# Patient Record
Sex: Female | Born: 1955 | Race: Black or African American | Hispanic: No | Smoking: Current every day smoker
Health system: Southern US, Community
[De-identification: ages and names within clinical notes are randomized; demographics above are authoritative.]

## PROBLEM LIST (undated history)

## (undated) DIAGNOSIS — M545 Low back pain, unspecified: Secondary | ICD-10-CM

## (undated) DIAGNOSIS — N898 Other specified noninflammatory disorders of vagina: Secondary | ICD-10-CM

## (undated) DIAGNOSIS — M79606 Pain in leg, unspecified: Secondary | ICD-10-CM

## (undated) DIAGNOSIS — R011 Cardiac murmur, unspecified: Secondary | ICD-10-CM

## (undated) DIAGNOSIS — N951 Menopausal and female climacteric states: Secondary | ICD-10-CM

## (undated) DIAGNOSIS — Z780 Asymptomatic menopausal state: Secondary | ICD-10-CM

## (undated) DIAGNOSIS — E041 Nontoxic single thyroid nodule: Secondary | ICD-10-CM

## (undated) DIAGNOSIS — I10 Essential (primary) hypertension: Secondary | ICD-10-CM

## (undated) DIAGNOSIS — N644 Mastodynia: Secondary | ICD-10-CM

## (undated) DIAGNOSIS — G479 Sleep disorder, unspecified: Secondary | ICD-10-CM

## (undated) HISTORY — DX: Low back pain, unspecified: M54.50

## (undated) HISTORY — DX: Asymptomatic menopausal state: Z78.0

## (undated) HISTORY — PX: OTHER SURGICAL HISTORY: SHX169

## (undated) HISTORY — PX: OVARY SURGERY: SHX727

## (undated) HISTORY — DX: Pain in leg, unspecified: M79.606

## (undated) HISTORY — DX: Mastodynia: N64.4

## (undated) HISTORY — DX: Low back pain: M54.5

## (undated) HISTORY — DX: Sleep disorder, unspecified: G47.9

## (undated) HISTORY — DX: Menopausal and female climacteric states: N95.1

## (undated) HISTORY — DX: Nontoxic single thyroid nodule: E04.1

## (undated) HISTORY — DX: Cardiac murmur, unspecified: R01.1

## (undated) HISTORY — DX: Essential (primary) hypertension: I10

## (undated) HISTORY — DX: Other specified noninflammatory disorders of vagina: N89.8

---

## 2001-02-10 ENCOUNTER — Other Ambulatory Visit: Admission: RE | Admit: 2001-02-10 | Discharge: 2001-02-10 | Payer: Self-pay | Admitting: Obstetrics and Gynecology

## 2001-02-15 ENCOUNTER — Encounter: Payer: Self-pay | Admitting: Obstetrics and Gynecology

## 2001-02-15 ENCOUNTER — Encounter: Admission: RE | Admit: 2001-02-15 | Discharge: 2001-02-15 | Payer: Self-pay | Admitting: Obstetrics and Gynecology

## 2002-04-22 ENCOUNTER — Other Ambulatory Visit: Admission: RE | Admit: 2002-04-22 | Discharge: 2002-04-22 | Payer: Self-pay | Admitting: Obstetrics and Gynecology

## 2003-05-24 ENCOUNTER — Other Ambulatory Visit: Admission: RE | Admit: 2003-05-24 | Discharge: 2003-05-24 | Payer: Self-pay | Admitting: Obstetrics and Gynecology

## 2004-10-16 ENCOUNTER — Other Ambulatory Visit: Admission: RE | Admit: 2004-10-16 | Discharge: 2004-10-16 | Payer: Self-pay | Admitting: Obstetrics and Gynecology

## 2004-12-10 ENCOUNTER — Ambulatory Visit (HOSPITAL_COMMUNITY): Admission: RE | Admit: 2004-12-10 | Discharge: 2004-12-10 | Payer: Self-pay | Admitting: Obstetrics and Gynecology

## 2004-12-10 ENCOUNTER — Encounter (INDEPENDENT_AMBULATORY_CARE_PROVIDER_SITE_OTHER): Payer: Self-pay | Admitting: Specialist

## 2009-02-20 ENCOUNTER — Encounter (INDEPENDENT_AMBULATORY_CARE_PROVIDER_SITE_OTHER): Payer: Self-pay

## 2009-02-27 ENCOUNTER — Ambulatory Visit: Payer: Self-pay | Admitting: Internal Medicine

## 2009-03-24 HISTORY — PX: OTHER SURGICAL HISTORY: SHX169

## 2009-03-29 ENCOUNTER — Ambulatory Visit: Payer: Self-pay | Admitting: Internal Medicine

## 2009-04-03 ENCOUNTER — Encounter: Payer: Self-pay | Admitting: Internal Medicine

## 2009-08-25 ENCOUNTER — Emergency Department (HOSPITAL_BASED_OUTPATIENT_CLINIC_OR_DEPARTMENT_OTHER): Admission: EM | Admit: 2009-08-25 | Discharge: 2009-08-25 | Payer: Self-pay | Admitting: Emergency Medicine

## 2009-08-25 ENCOUNTER — Ambulatory Visit: Payer: Self-pay | Admitting: Diagnostic Radiology

## 2010-04-25 NOTE — Letter (Signed)
Summary: Patient Notice- Polyp Results  [facility]  [address] [place], [place] [address]   Phone: [phone]  Fax: [phone]        April 03, 2009 MRN: [id number]    Ashley Montgomery [address] [place], [place]  [address]    Dear Ashley Montgomery,  I am pleased to inform you that the colon polyp(s) removed during your recent colonoscopy was (were) found to be benign (no cancer detected) upon pathologic examination.The polyp was hyperplastic ( not premalignant)  I recommend you have a repeat colonoscopy examination in 10 _ years to look for recurrent polyps, as having colon polyps increases your risk for having recurrent polyps or even colon cancer in the future.  Should you develop new or worsening symptoms of abdominal pain, bowel habit changes or bleeding from the rectum or bowels, please schedule an evaluation with either your primary care physician or with me.  Additional information/recommendations:  _x_ No further action with gastroenterology is needed at this time. Please      follow-up with your primary care physician for your other healthcare      needs.  __ Please call [phone] to schedule a return visit to review your      situation.  __ Please keep your follow-up visit as already scheduled.  __ Continue treatment plan as outlined the day of your exam.  Please call us if you are having persistent problems or have questions about your condition that have not been fully answered at this time.  Sincerely,  Hart Carwin MD  This letter has been electronically signed by your physician.  Appended Document: Patient Notice- Polyp Results Letter mailed 01.12.11

## 2010-04-25 NOTE — Procedures (Signed)
Summary: Colonoscopy  Patient: [name] Note: All result statuses are Final unless otherwise noted.  Tests: (1) Colonoscopy (COL)   COL Colonoscopy           DONE     [facility]     [address]     [place], [place]  [address]           COLONOSCOPY PROCEDURE REPORT           PATIENT:  Ashley, Montgomery  MR#:  [id number]     BIRTHDATE:  02-Mar-1956, 53 yrs. old  GENDER:  female           ENDOSCOPIST:  Hedwig Morton. Juanda Chance, MD     Referred by:  Richardean Chimera, M.D.           PROCEDURE DATE:  03/29/2009     PROCEDURE:  Colonoscopy 19147     ASA CLASS:  Class I     INDICATIONS:  Routine Risk Screening           MEDICATIONS:   Versed 6 mg, Fentanyl 75 mcg           DESCRIPTION OF PROCEDURE:   After the risks benefits and     alternatives of the procedure were thoroughly explained, informed     consent was obtained.  Digital rectal exam was performed and     revealed no rectal masses.   The LB [id number] endoscope     was introduced through the anus and advanced to the cecum, which     was identified by both the appendix and ileocecal valve, without     limitations.  The quality of the prep was good, using MiraLax.     The instrument was then slowly withdrawn as the colon was fully     examined.     <<PROCEDUREIMAGES>>           FINDINGS:  There were multiple polyps identified and removed. in     the rectum and sigmoid colon. 6 diminutive polyps removed from     rectum-20 cm The polyps were removed using cold biopsy forceps     (see image4, image3, and image5).  This was otherwise a normal     examination of the colon (see image6, image1, and image2).     Retroflexed views in the rectum revealed no abnormalities.    The     scope was then withdrawn from the patient and the procedure     completed.           COMPLICATIONS:  None           ENDOSCOPIC IMPRESSION:     1) Polyps, multiple in the rectum and sigmoid colon     2) Otherwise normal examination     6  diminutive polyps removed from 0-20 cm     small internal hemorrhoids     RECOMMENDATIONS:     1) Await pathology results     2) high fiber diet           REPEAT EXAM:  In 10 year(s) for.           ______________________________     Hedwig Morton. Juanda Chance, MD           CC:           n.     eSIGNED:   Hedwig Morton. Gelisa Tieken at 03/29/2009 04:23 PM           Humberto Leep, [id number]  Note: An exclamation mark (!) indicates a result that was not dispersed into the flowsheet. Document Creation Date: 03/29/2009 4:22 PM _______________________________________________________________________  (1) Order result status: Final Collection or observation date-time: 03/29/2009 16:15 Requested date-time:  Receipt date-time:  Reported date-time:  Referring Physician:   Ordering Physician: Lina Sar [phone]) Specimen Source:  Source: Launa Grill Order Number: [id number] Lab site:   Appended Document: Colonoscopy     Procedures Next Due Date:    Colonoscopy: 03/2019

## 2010-08-09 NOTE — Op Note (Signed)
NAMENARI, Ashley Montgomery                 ACCOUNT NO.:  [id number]   MEDICAL RECORD NO.:  [id number]          PATIENT TYPE:  AMB   LOCATION:  SDC                           FACILITY:  [facility]   PHYSICIAN:  Juluis Mire, M.D.   DATE OF BIRTH:  10-21-1955   DATE OF PROCEDURE:  12/10/2004  DATE OF DISCHARGE:                                 OPERATIVE REPORT   PREOPERATIVE DIAGNOSES:  1.  Abnormal uterine bleeding.  2.  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   POSTOPERATIVE DIAGNOSES:  1.  Extensive pelvic adhesions.  2.  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  3.  Endometrial intrauterine synechiae.  4.  Small submucosal fibroid.   OPERATIVE PROCEDURES:  1.  Open laparoscopy.  2.  Extensive lysis of adhesions.  3.  Right salpingo-oophorectomy.  4.  Hysteroscopy.  5.  Dilatation and curettage.   SURGEON:  Juluis Mire, M.D.   ANESTHESIA:  General endotracheal.   ESTIMATED BLOOD LOSS:  Minimal.   PACKS AND DRAINS:  None.   INTRAOPERATIVE BLOOD REPLACED:  None.   COMPLICATIONS:  None.   INDICATIONS:  As noted in the history and physical.   PROCEDURE:  The patient was taken to the OR and placed in the supine  position.  After a satisfactory level of general endotracheal anesthesia  obtained, the patient was placed in the dorsal lithotomy position using the  Allen stirrups.  The abdomen, perineum and vagina were prepped out with  Betadine.  Exam under anesthesia did reveal a cul-de-sac fullness consistent  with known ovarian cyst.  The bladder was emptied by in-and-out  catheterization.  A Hulka tenaculum was put in place and secured.  The  patient was then draped as a sterile field.  A subumbilical incision made  with the knife and extended through the subcutaneous tissue.  The fascia was  entered sharply and the incision in the fascia extended laterally.  The  rectus muscles were separated and the perineum was entered  bluntly.  The  Taut open laparoscopic trocar was then introduced and secured.  The abdomen  was inflated with carbon dioxide.  The laparoscope was introduced.  It was  noted that the uterus had a large adhesion from the anterior part to the  left anterior abdominal wall.  This held the uterus up on the left side.  The left tube and ovary were basically unremarkable.  The ovary was somewhat  involved in adhesions.  There were adhesions from the anterior abdominal  wall between that and what looked like to be the small intestines to the  left side.  It was not involved in the entry.  On the right side there was a  large cyst involving the right ovary that filled the cul-de-sac.  It was  smooth-walled.  There were no excrescences and actually no adhesions.  The  cecum, however, was adherent to the anterior part of the uterine fundus on  the right side and somewhat adherent to the right pelvic area above the  cyst.  A 5 mm trocar  was put in place in the right lower quadrant after  visualizing the epigastric vessels.  A suprapubic trocar was put in place  under direct visualization.  We used the Gyrus bipolar.  We first took down  the adhesions, which were thin, between the anterior part of the uterine  fundus and the cecum using cautery incision.  We did note that the cautery  spread a little bit to the cecum but only minimally so.  There was no  evidence of full thickness on my evaluation.  We irrigated the area.  We  then continued taking down the adhesions between the cecum and the right  pelvic area above the cyst.  At this point in time we had nicely mobilized  up the cecum and it was away from the ovary.  At this point in time we were  able to flip the large ovarian cyst out of the pelvis.  We decided to begin  with the utero-ovarian area.  The utero-ovarian ligament then cauterized,  incised using the Gyrus.  We then dissected the cystic enlargement of the  ovary from its peritoneal  attachment down to the ovarian vasculature.  The  cyst was then elevated.  We could easily trace out the course of the ureter.  We could also visualize the cecum, and the ovarian vasculature was  cauterized, incised using the Gyrus.  At this point in time we took out the  Taut open laparoscopic trocar and laparoscope.  We brought the cyst under  the umbilicus.  It was grabbed with two Kellys.  We incised, and clear fluid  was obtained.  We deflated the cyst with the Gyrus.  The ovary was then  removed intact through the subumbilical port.  There was a smooth wall  lining.  There was no evidence of excrescences or solid areas.  It was sent  for pathologic review.  Of note, we did obtain washings prior to the  procedure.  There did not appear to be any spillage into the abdominal  cavity from the cyst.  At this point in time the Taut open laparoscopic  trocar was reintroduced.  The abdomen was reinflated of its carbon dioxide.  The laparoscope was reintroduced.  Visualization of the area of removal of  the ovary appeared to be hemostatically intact even after deflation.  We  could trace the ureter out on the right side.  It was uninvolved in the  operative procedure.  The cecum appeared to be intact on that side too.  Again we thoroughly irrigated the pelvis.  We had excellent hemostasis.  The  abdomen was deflated of its carbon dioxide, all trocars removed, the  subumbilical fascia closed with two figure-of-eights of 0 Vicryl, the skin  with interrupted subcuticulars of 4-0 Vicryl.  The suprapubic incisions were  closed with Dermabond.   At this point in time the legs were repositioned, the Hulka tenaculum was  removed.  A speculum was placed in the vaginal vault.  The cervix was  grasped with a single-tooth tenaculum.  The uterus sounded to 8 cm.  The  cervix was serially dilated to a size 35 Pratt dilator.  The operative  hysteroscope was introduced into the intrauterine cavity.  The  intrauterine cavity was distended using sorbitol.  Visualization revealed what looked  like a fundal synechia.  There was a submucosal fibroid to the right which  was beyond the synechia, which we could break down.  We therefore obtained  endometrial curettings, which were  sent for pathologic review.  We had good  hemostasis, no signs of perforation.  The single-tooth tenaculum and  speculum were removed.  The patient was taken out of the dorsal lithotomy  position, once alert extubated and transferred to the recovery room in good  condition.  Sponge,  instrument and needle reported as correct by the  circulating nurse x2.      Juluis Mire, M.D.  Electronically Signed     JSM/MEDQ  D:  12/10/2004  T:  12/10/2004  Job:  [id number]

## 2010-08-09 NOTE — H&P (Signed)
NAME:  Ashley Montgomery, Ashley Montgomery NO.:  [id number]   MEDICAL RECORD NO.:  [id number]           PATIENT TYPE:   LOCATION:                                 FACILITY:   PHYSICIAN:  Juluis Mire, M.D.        DATE OF BIRTH:   DATE OF ADMISSION:  12/10/2004  DATE OF DISCHARGE:                                HISTORY & PHYSICAL   Patient is a 55 year old gravida 2, para 1, abortus 1 black female who  presents for evaluation of cystic enlargement of the left adnexa as well as  abnormal bleeding.   In relation to the present admission, patient has a history of uterine  fibroids.  These were noted on previous infusion ultrasound.  She has been  having some abnormal bleeding when seen for her annual examination on July  26.  Because of this an examination was performed.  It was felt like the  uterus had enlarged 9-10 weeks.  She underwent a follow-up ultrasound  evaluation.  Saline infusion revealed a fibroid in the anterior wall that  protruded into the endometrial cavity.  However, the most significant  finding was an 8-9 cm mostly cystic simple cyst of the left ovary.  There  was one small septation.  There was no free fluid.  It did have some  findings suggestive of mucinous cystadenoma.  Subsequent CA-125 was within  normal limits.  Follow-up ultrasound revealed persistence of this 9 cm  cystic mass of the left adnexa.  In view of this we are going to proceed  with hysteroscopy for evaluation of endometrial cavity as well as  laparoscopy with left salpingo-oophorectomy.  She does understand the  potential need for exploratory surgery with possible surgical management  through the incision to remove the left ovary and possible hysterectomy and  removal of right ovary pending the results from pathology.   ALLERGIES:  She has no known drug allergies.   MEDICATIONS:  None.   PAST MEDICAL HISTORY:  Usual childhood diseases without any significant  sequelae.   PAST SURGICAL  HISTORY:  She did have a cesarean section with her first  pregnancy for fetal distress.  She has had ovarian cysts removed twice in  1978 and 1990.  She has also had a previous D&E for nonviable first  trimester pregnancy.   FAMILY HISTORY:  Noncontributory.   SOCIAL HISTORY:  Tobacco use.  She smokes approximately 1/3 of a pack per  day.  Occasional alcohol use.   REVIEW OF SYSTEMS:  Noncontributory.   PHYSICAL EXAMINATION:  VITAL SIGNS:  Patient is afebrile with stable vital  signs.  HEENT:  Patient is normocephalic.  Pupils are equal, round, and reactive to  light and accommodation.  Extraocular movements were intact.  Sclerae and  conjunctivae clear.  Oropharynx clear.  NECK:  Without thyromegaly.  BREASTS:  No discrete masses.  LUNGS:  Clear.  CARDIOVASCULAR:  Regular rate and rhythm without murmurs or gallops.  ABDOMEN:  Benign.  No masses, organomegaly, or tenderness.  PELVIC:  Normal external genitalia.  Vaginal mucosa clear.  Cervix  unremarkable.  Uterus enlarged with left side fullness consistent with known  ovarian cysts.  Right adnexa unremarkable.  EXTREMITIES:  Trace edema.  NEUROLOGIC:  Grossly within normal limits.   IMPRESSION:  1.  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  2.  Abnormal bleeding probably anovulatory nature.  3.  Uterine fibroids.   PLAN:  Patient will undergo open laparoscopy with attempt at left salpingo-  oophorectomy.  If we are unable to approach this through the laparoscope an  incision will be made for proper evaluation and removal of the left tube and  ovary.  Uterus may be removed at that point in time in view of her abnormal  bleeding pattern.  The remainder of her ovary could be left in place pending  pathology from the left ovary.  Should this be a borderline or malignant  tumor further staging procedures may be undertaken.  The risks of surgery  have been discussed including the risk of infection, the  risk of hemorrhage  that could require transfusion, the risk of AIDS or hepatitis, the risk of  injury to adjacent organs including bladder, bowel, or ureters that could  require further exploratory surgery, the risk of deep venous thrombosis and  pulmonary embolus.  Patient expressed understanding of indications and  risks.      Juluis Mire, M.D.  Electronically Signed     JSM/MEDQ  D:  12/10/2004  T:  12/10/2004  Job:  [id number]

## 2014-01-06 ENCOUNTER — Encounter: Payer: Self-pay | Admitting: Internal Medicine

## 2014-06-01 ENCOUNTER — Other Ambulatory Visit: Payer: Self-pay | Admitting: Obstetrics and Gynecology

## 2014-06-01 DIAGNOSIS — N644 Mastodynia: Secondary | ICD-10-CM

## 2014-06-06 ENCOUNTER — Ambulatory Visit
Admission: RE | Admit: 2014-06-06 | Discharge: 2014-06-06 | Disposition: A | Discharge status: Home / Self Care | Payer: BC Managed Care – PPO | Source: Ambulatory Visit | Attending: Obstetrics and Gynecology | Admitting: Obstetrics and Gynecology

## 2014-06-06 DIAGNOSIS — N644 Mastodynia: Secondary | ICD-10-CM

## 2016-03-12 ENCOUNTER — Other Ambulatory Visit: Payer: Self-pay | Admitting: Obstetrics and Gynecology

## 2016-03-12 DIAGNOSIS — E041 Nontoxic single thyroid nodule: Secondary | ICD-10-CM

## 2016-03-12 DIAGNOSIS — M79622 Pain in left upper arm: Secondary | ICD-10-CM

## 2016-03-13 ENCOUNTER — Ambulatory Visit
Admission: RE | Admit: 2016-03-13 | Discharge: 2016-03-13 | Disposition: A | Discharge status: Home / Self Care | Payer: BC Managed Care – PPO | Source: Ambulatory Visit | Attending: Obstetrics and Gynecology | Admitting: Obstetrics and Gynecology

## 2016-03-13 DIAGNOSIS — E041 Nontoxic single thyroid nodule: Secondary | ICD-10-CM

## 2016-03-25 ENCOUNTER — Other Ambulatory Visit: Payer: BC Managed Care – PPO

## 2016-04-16 ENCOUNTER — Ambulatory Visit: Payer: BC Managed Care – PPO | Admitting: Cardiovascular Disease

## 2016-05-02 ENCOUNTER — Ambulatory Visit
Admission: RE | Admit: 2016-05-02 | Discharge: 2016-05-02 | Disposition: A | Discharge status: Home / Self Care | Payer: BC Managed Care – PPO | Source: Ambulatory Visit | Attending: Obstetrics and Gynecology | Admitting: Obstetrics and Gynecology

## 2016-05-02 DIAGNOSIS — M79622 Pain in left upper arm: Secondary | ICD-10-CM

## 2016-06-03 ENCOUNTER — Encounter: Payer: Self-pay | Admitting: Cardiovascular Disease

## 2016-06-03 ENCOUNTER — Other Ambulatory Visit: Payer: Self-pay | Admitting: Cardiovascular Disease

## 2016-06-09 ENCOUNTER — Encounter: Payer: Self-pay | Admitting: Cardiovascular Disease

## 2016-06-09 ENCOUNTER — Encounter (INDEPENDENT_AMBULATORY_CARE_PROVIDER_SITE_OTHER): Payer: Self-pay

## 2016-06-09 ENCOUNTER — Ambulatory Visit (INDEPENDENT_AMBULATORY_CARE_PROVIDER_SITE_OTHER): Payer: BC Managed Care – PPO | Admitting: Cardiovascular Disease

## 2016-06-09 VITALS — BP 150/100 | HR 62 | Ht 61.0 in | Wt 154.0 lb

## 2016-06-09 DIAGNOSIS — E782 Mixed hyperlipidemia: Secondary | ICD-10-CM | POA: Diagnosis not present

## 2016-06-09 NOTE — Progress Notes (Signed)
Cardiology Office Note   Date:  06/09/2016   ID:  Ashley Montgomery, DOB February 23, 1956, MRN [id number]  PCP:  Ashley MireMCCOMB,JOHN S, MD  Cardiologist:   Ashley MissPhilip Rhys Anchondo, MD   Chief Complaint  Patient presents with  . Hypertension   Problem List 1. Elevated CRP 2. Essential HTN    History of Present Illness: Ashley Montgomery is a 61 y.o. female who presents for eval of mildly Elevated cholesterol levels and normal CRP levels.  Typically exercises but has not done much this winter.  No CP or dyspnea.  Is very active.   Was incidentally noted to have a mildly elevated LDL and a CRP of 1.5 ( average)  Does not eat a restricted diet. Does not eat much meat .   Walks lots in the summer. Working on her doctorate degree.   Smokes occasionally 1-2 cigarettes a day .  Encouraged her to stop   Mother died of an MI   Past Medical History:  Diagnosis Date  . Breast pain, left   . Hot flashes, menopausal   . Left thyroid nodule   . Low back pain radiating to lower extremity   . Postmenopausal   . Sleep disturbances   . Vaginal dryness     Past Surgical History:  Procedure Laterality Date  . colonscopy  2011  . vasomotor symptomatology       Current Outpatient Prescriptions  Medication Sig Dispense Refill  . aspirin 81 MG chewable tablet Chew 81 mg by mouth daily.     No current facility-administered medications for this visit.     PAD Screen 06/09/2016  Previous PAD dx? No  Previous surgical procedure? No  Pain with walking? No  Feet/toe relief with dangling? No  Painful, non-healing ulcers? No  Extremities discolored? No      Allergies:   Patient has no known allergies.    Social History:  The patient  reports that she has been smoking.  She has never used smokeless tobacco. She reports that she drinks alcohol. She reports that she does not use drugs.   Family History:  The patient'Montgomery family history includes Diabetes in her sister; Heart attack (age of onset: 5540) in her  mother; Heart failure in her sister.    ROS:  Please see the history of present illness.    Review of Systems: Constitutional:  denies fever, chills, diaphoresis, appetite change and fatigue.  HEENT: denies photophobia, eye pain, redness, hearing loss, ear pain, congestion, sore throat, rhinorrhea, sneezing, neck pain, neck stiffness and tinnitus.  Respiratory: denies SOB, DOE, cough, chest tightness, and wheezing.  Cardiovascular: denies chest pain, palpitations and leg swelling.  Gastrointestinal: denies nausea, vomiting, abdominal pain, diarrhea, constipation, blood in stool.  Genitourinary: denies dysuria, urgency, frequency, hematuria, flank pain and difficulty urinating.  Musculoskeletal: denies  myalgias, back pain, joint swelling, arthralgias and gait problem.   Skin: denies pallor, rash and wound.  Neurological: denies dizziness, seizures, syncope, weakness, light-headedness, numbness and headaches.   Hematological: denies adenopathy, easy bruising, personal or family bleeding history.  Psychiatric/ Behavioral: denies suicidal ideation, mood changes, confusion, nervousness, sleep disturbance and agitation.       All other systems are reviewed and negative.    PHYSICAL EXAM: VS:  BP (!) 150/100 (BP Location: Left Arm, Patient Position: Sitting, Cuff Size: Normal)   Pulse 62   Ht 5\' 1"  (1.549 m)   Wt 154 lb (69.9 kg)   SpO2 99%   BMI 29.10 kg/m  ,  BMI Body mass index is 29.1 kg/m. GEN: Well nourished, well developed, in no acute distress  HEENT: normal  Neck: no JVD, carotid bruits, or masses Cardiac: RRR; no murmurs, rubs, or gallops,no edema  Respiratory:  clear to auscultation bilaterally, normal work of breathing GI: soft, nontender, nondistended, + BS MS: no deformity or atrophy  Skin: warm and dry, no rash Neuro:  Strength and sensation are intact Psych: normal   EKG:  EKG is ordered today. The ekg ordered today demonstrates  NSR at 62.   , normal     Recent Labs: No results found for requested labs within last 8760 hours.    Lipid Panel No results found for: CHOL, TRIG, HDL, CHOLHDL, VLDL, LDLCALC, LDLDIRECT    Wt Readings from Last 3 Encounters:  06/09/16 154 lb (69.9 kg)      Other studies Reviewed: Additional studies/ records that were reviewed today include: . Records from Dr. Gaye Alken.   Review of the above records demonstrates: normal CRP Mildly elevated cholesterol levels    ASSESSMENT AND PLAN:  1.  Mild hyperlipidemia:  Recent labs reveal an LDL cholesterol of 120. Her CRP is 1.5.  Overall these numbers are only very minimally elevated. She smokes 1-2 cigarettes a day.  She admits that she has not been getting as much exercise as she normally does. She also admits that she does not pick attention to her diet. She does not want to start a statin medication at this time. She would like to try diet and exercise. I think that she is at relatively low risk I think this is a good option.  If she is not able to bring her cholesterol down,   I advised her that low dose atorvastatin ( 10-20 mg a day ) would probably work.  I will see her on an as-needed basis.   Current medicines are reviewed at length with the patient today.  The patient does not have concerns regarding medicines.  Labs/ tests ordered today include:  No orders of the defined types were placed in this encounter.    Disposition:   FU with me as needed.      Ashley Miss, MD  06/09/2016 8:55 AM    [facility] [address] [place], [place], [place]  [address] Phone: [phone]; Fax: [phone]

## 2016-06-09 NOTE — Patient Instructions (Signed)

## 2017-07-01 ENCOUNTER — Other Ambulatory Visit: Payer: Self-pay | Admitting: Obstetrics and Gynecology

## 2017-07-01 DIAGNOSIS — E041 Nontoxic single thyroid nodule: Secondary | ICD-10-CM

## 2017-07-06 ENCOUNTER — Ambulatory Visit
Admission: RE | Admit: 2017-07-06 | Discharge: 2017-07-06 | Disposition: A | Discharge status: Home / Self Care | Payer: BC Managed Care – PPO | Source: Ambulatory Visit | Attending: Obstetrics and Gynecology | Admitting: Obstetrics and Gynecology

## 2017-07-06 DIAGNOSIS — E041 Nontoxic single thyroid nodule: Secondary | ICD-10-CM

## 2019-06-02 ENCOUNTER — Ambulatory Visit: Payer: BC Managed Care – PPO | Attending: Family

## 2019-06-02 DIAGNOSIS — Z23 Encounter for immunization: Secondary | ICD-10-CM

## 2019-06-02 NOTE — Progress Notes (Signed)
   Covid-19 Vaccination Clinic  Name:  Ashley Montgomery    MRN: [id number] DOB: [birth year]  06/02/2019  [name] was observed post Covid-19 immunization for 15 minutes without incident. She was provided with Vaccine Information Sheet and instruction to access the V-Safe system.   [name] was instructed to call 911 with any severe reactions post vaccine: Marland Kitchen Difficulty breathing  . Swelling of face and throat  . A fast heartbeat  . A bad rash all over body  . Dizziness and weakness   Immunizations Administered    Name Date Dose VIS Date Route   Moderna COVID-19 Vaccine 06/02/2019  3:52 PM 0.5 mL 02/22/2019 Intramuscular   Manufacturer: Moderna   Lot: [id number]   NDC: 40992-780-04

## 2019-07-05 ENCOUNTER — Ambulatory Visit: Payer: BC Managed Care – PPO

## 2019-07-05 ENCOUNTER — Ambulatory Visit: Payer: BC Managed Care – PPO | Attending: Family

## 2019-07-05 DIAGNOSIS — Z23 Encounter for immunization: Secondary | ICD-10-CM

## 2019-07-05 NOTE — Progress Notes (Signed)
   Covid-19 Vaccination Clinic  Name:  Ashley Montgomery    MRN: [id number] DOB: [birth year]  07/05/2019  [name] was observed post Covid-19 immunization for 15 minutes without incident. She was provided with Vaccine Information Sheet and instruction to access the V-Safe system.   [name] was instructed to call 911 with any severe reactions post vaccine: Marland Kitchen Difficulty breathing  . Swelling of face and throat  . A fast heartbeat  . A bad rash all over body  . Dizziness and weakness   Immunizations Administered    Name Date Dose VIS Date Route   Moderna COVID-19 Vaccine 07/05/2019  1:12 PM 0.5 mL 02/22/2019 Intramuscular   Manufacturer: Moderna   Lot: [id number]   NDC: 83073-543-01

## 2021-02-13 ENCOUNTER — Encounter: Payer: Self-pay | Admitting: Gastroenterology

## 2021-04-02 ENCOUNTER — Ambulatory Visit (AMBULATORY_SURGERY_CENTER): Payer: BC Managed Care – PPO

## 2021-04-02 ENCOUNTER — Other Ambulatory Visit: Payer: Self-pay

## 2021-04-02 VITALS — Ht 61.0 in | Wt 142.0 lb

## 2021-04-02 DIAGNOSIS — Z1211 Encounter for screening for malignant neoplasm of colon: Secondary | ICD-10-CM

## 2021-04-02 MED ORDER — NA SULFATE-K SULFATE-MG SULF 17.5-3.13-1.6 GM/177ML PO SOLN: 1.0000 | Freq: Once | ORAL | 0 refills | Status: AC

## 2021-04-02 NOTE — Progress Notes (Signed)

## 2021-04-16 ENCOUNTER — Telehealth: Payer: Self-pay

## 2021-04-16 ENCOUNTER — Encounter: Payer: Self-pay | Admitting: Gastroenterology

## 2021-04-16 ENCOUNTER — Encounter: Payer: BC Managed Care – PPO | Admitting: Gastroenterology

## 2021-04-16 NOTE — Telephone Encounter (Signed)
Called pt to see if she was on her way for appt today. Pt stated that she would not be coming to appt today because she had "other appts". She did not want to reschedule today, will call back to reschedule.

## 2021-04-16 NOTE — Telephone Encounter (Signed)
Thank you for the update. This should be regarded as a no show.

## 2021-05-31 ENCOUNTER — Other Ambulatory Visit: Payer: Self-pay | Admitting: Nurse Practitioner

## 2021-05-31 DIAGNOSIS — M79622 Pain in left upper arm: Secondary | ICD-10-CM

## 2021-06-21 ENCOUNTER — Ambulatory Visit
Admission: RE | Admit: 2021-06-21 | Discharge: 2021-06-21 | Disposition: A | Discharge status: Home / Self Care | Payer: BC Managed Care – PPO | Source: Ambulatory Visit | Attending: Nurse Practitioner | Admitting: Nurse Practitioner

## 2021-06-21 ENCOUNTER — Other Ambulatory Visit: Payer: Self-pay | Admitting: Nurse Practitioner

## 2021-06-21 DIAGNOSIS — M79622 Pain in left upper arm: Secondary | ICD-10-CM

## 2021-06-21 DIAGNOSIS — N644 Mastodynia: Secondary | ICD-10-CM

## 2021-09-18 ENCOUNTER — Encounter: Payer: Self-pay | Admitting: Gastroenterology

## 2023-05-25 IMAGING — MG MM DIGITAL DIAGNOSTIC UNILAT*L* W/ TOMO W/ CAD
8 series · 8 of 24 positions shown · non-contrast
Comparison: Previous exam(s).

CLINICAL DATA: 65-year-old female with focal pain in the left
breast and axilla.

EXAM:
DIGITAL DIAGNOSTIC UNILATERAL LEFT MAMMOGRAM WITH TOMOSYNTHESIS AND
CAD; ULTRASOUND LEFT BREAST LIMITED
TECHNIQUE: Left digital diagnostic mammography and breast tomosynthesis was
performed. The images were evaluated with computer-aided detection.;
Targeted ultrasound examination of the left breast was performed.

[L MLO synth-2D (1 of 3)]
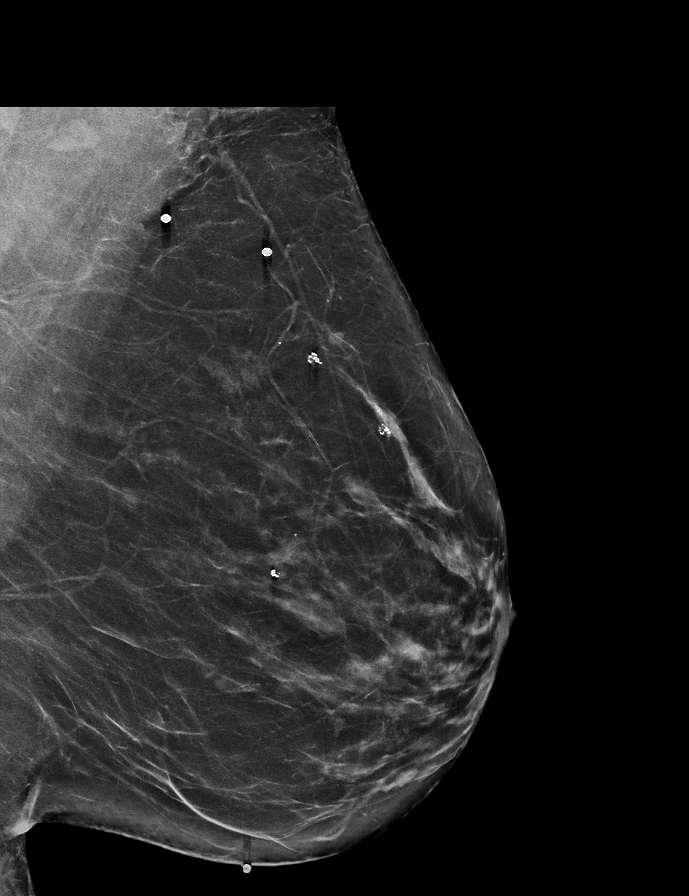

[L MLO synth-2D (2 of 3)]
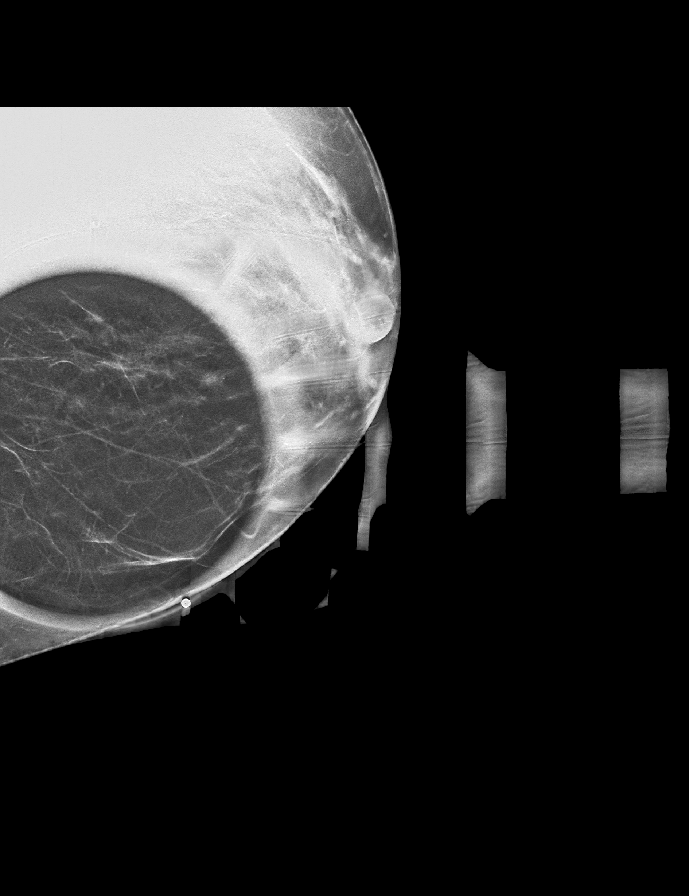

[L MLO synth-2D (3 of 3)]
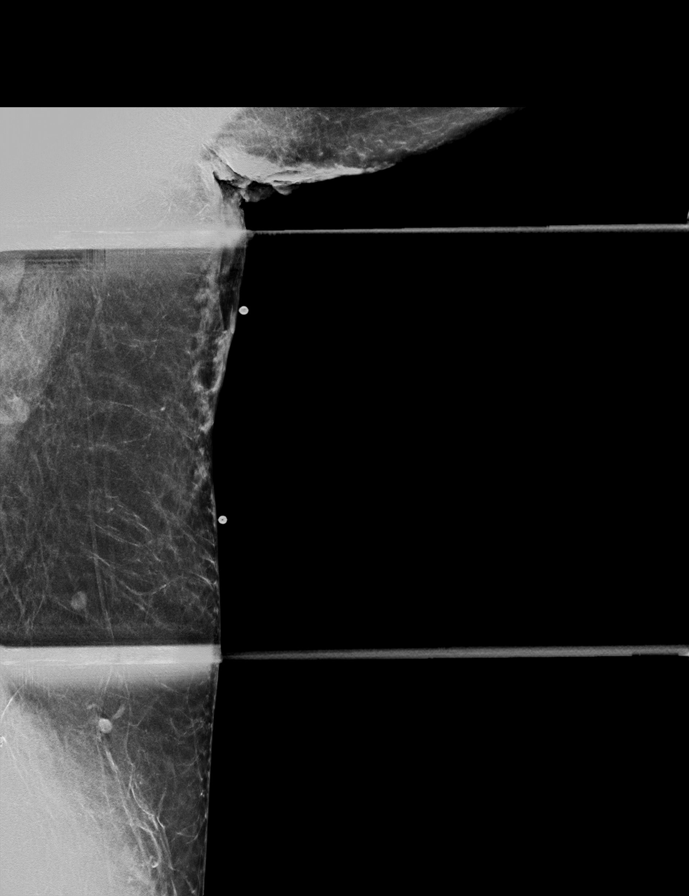

[L CC synth-2D]
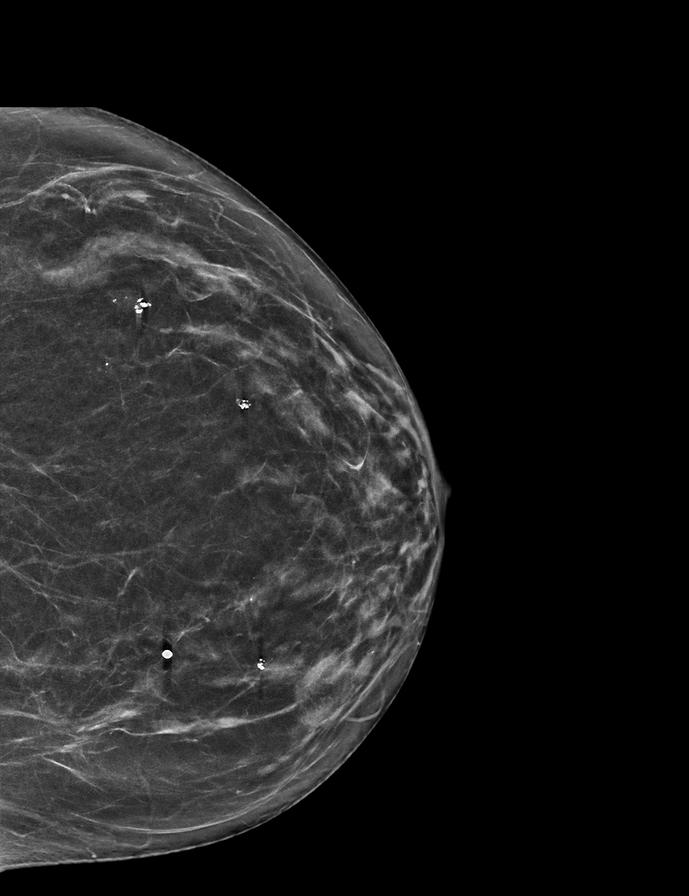

[L MLO tomo (1 of 3) · tomo slice 23/45.0]
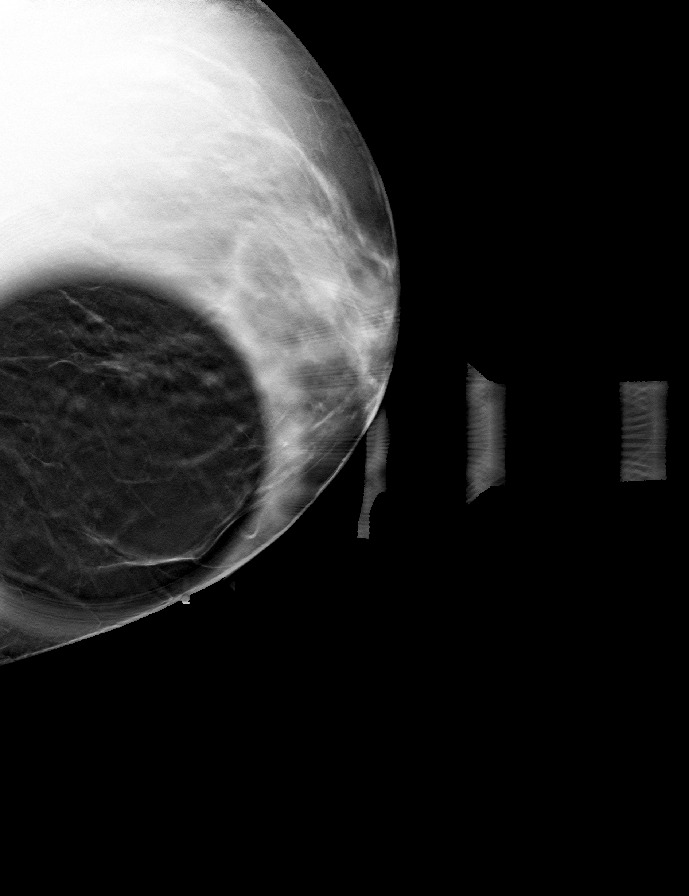

[L MLO tomo (2 of 3) · tomo slice 27/53.0]
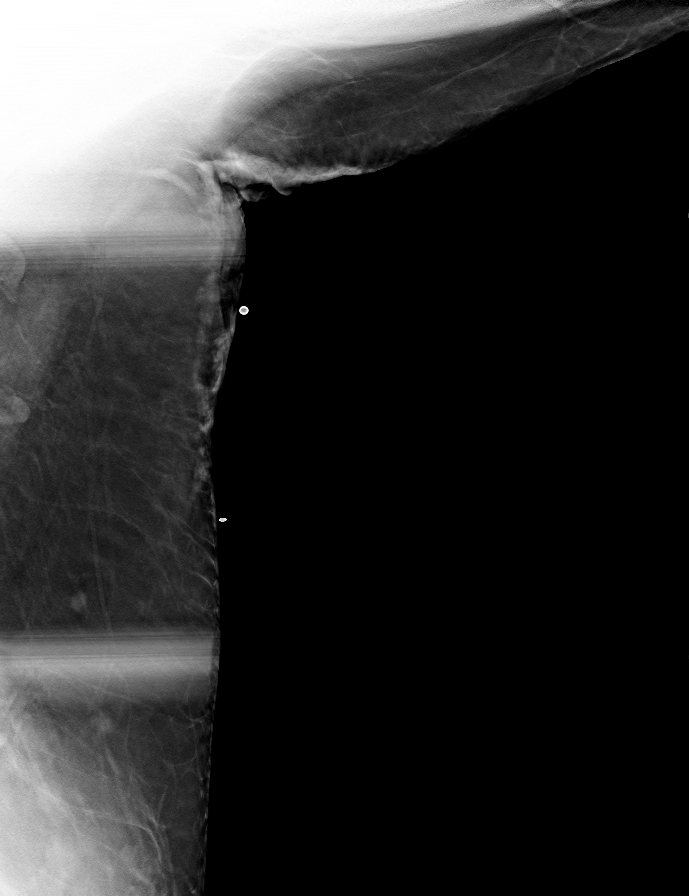

[L CC tomo · tomo slice 30/59.0]
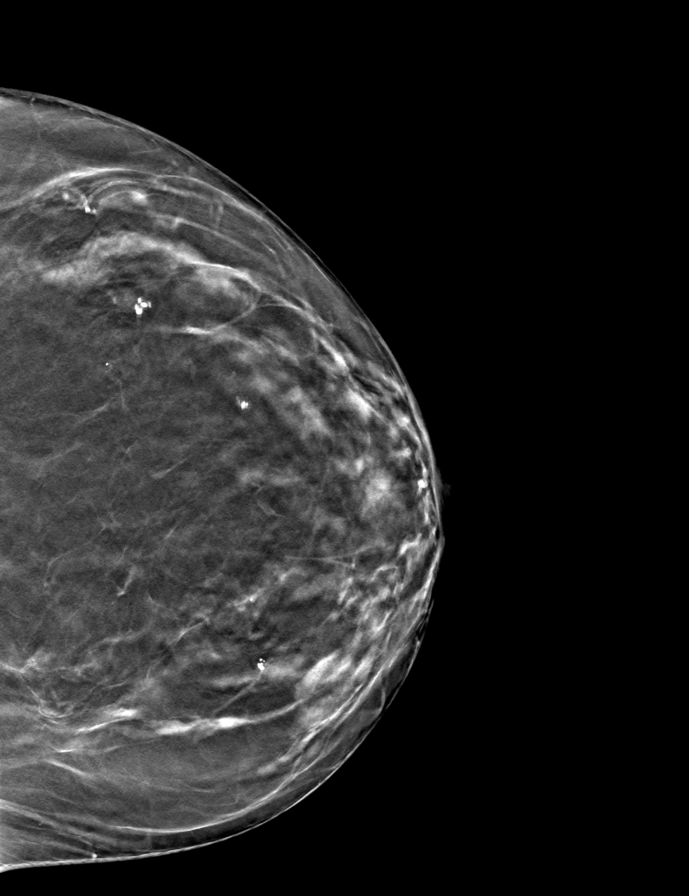

[L MLO tomo (3 of 3) · tomo slice 35/70.0]
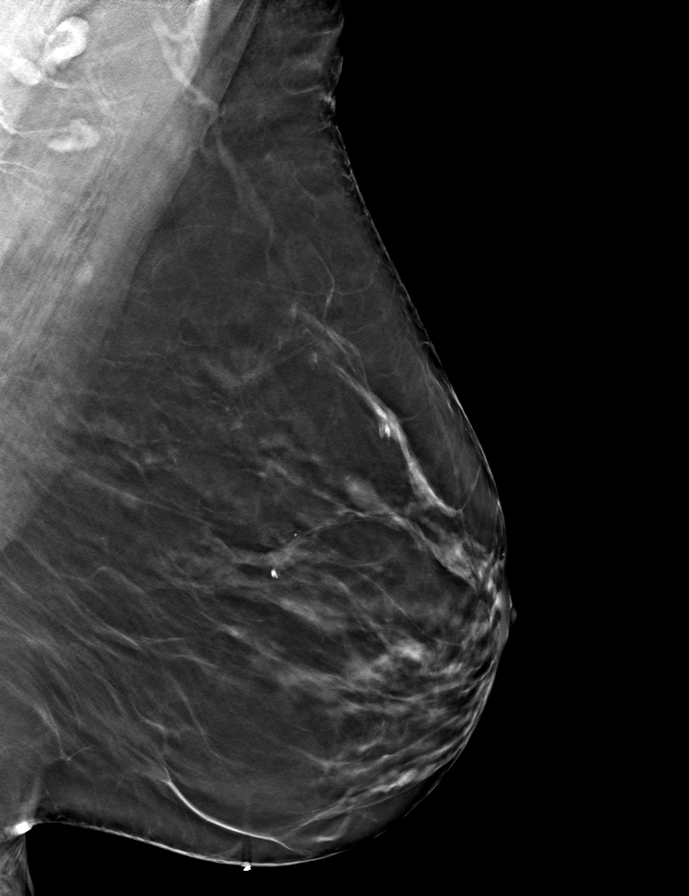

[8 of 24 positions shown; findings below may reference images not displayed]

ACR Breast Density Category c: The breast tissue is heterogeneously
dense, which may obscure small masses.
FINDINGS: Radiopaque BBs were placed at the sites of the patient's focal
symptoms in the inferior left breast and left axilla. No focal or
suspicious mammographic findings are seen deep to the radiopaque BB
or elsewhere within the left breast.

Targeted ultrasound is performed, showing focal or suspicious
sonographic findings within the inferior left breast and left
axilla. Morphologically normal lymph nodes noted within the left
axilla.
IMPRESSION: Unremarkable mammographic and sonographic evaluation of the left
breast and axilla.

RECOMMENDATION:
1. Clinical follow-up recommended for the painful areas of concern
in the left breast and axilla. Any further workup should be based on
clinical grounds.
2. The patient is due for routine annual screening in December 2021.

I have discussed the findings and recommendations with the patient.
If applicable, a reminder letter will be sent to the patient
regarding the next appointment.

BI-RADS CATEGORY  1: Negative.

## 2023-10-16 ENCOUNTER — Other Ambulatory Visit: Payer: Self-pay | Admitting: Obstetrics and Gynecology

## 2023-10-16 DIAGNOSIS — E049 Nontoxic goiter, unspecified: Secondary | ICD-10-CM

## 2023-10-21 ENCOUNTER — Inpatient Hospital Stay
Admission: RE | Admit: 2023-10-21 | Discharge: 2023-10-21 | Discharge status: Home / Self Care | Payer: Self-pay | Source: Ambulatory Visit | Attending: Obstetrics and Gynecology | Admitting: Obstetrics and Gynecology

## 2023-10-21 DIAGNOSIS — E049 Nontoxic goiter, unspecified: Secondary | ICD-10-CM

## 2023-11-18 ENCOUNTER — Other Ambulatory Visit (HOSPITAL_COMMUNITY): Payer: Self-pay | Admitting: Neurosurgery

## 2023-11-18 DIAGNOSIS — R011 Cardiac murmur, unspecified: Secondary | ICD-10-CM

## 2023-11-19 ENCOUNTER — Ambulatory Visit (HOSPITAL_COMMUNITY)
Admission: RE | Admit: 2023-11-19 | Discharge: 2023-11-19 | Disposition: A | Discharge status: Home / Self Care | Source: Ambulatory Visit | Attending: Neurosurgery | Admitting: Neurosurgery

## 2023-11-19 DIAGNOSIS — I351 Nonrheumatic aortic (valve) insufficiency: Secondary | ICD-10-CM | POA: Diagnosis not present

## 2023-11-19 DIAGNOSIS — R011 Cardiac murmur, unspecified: Secondary | ICD-10-CM | POA: Diagnosis present

## 2023-11-19 LAB — ECHOCARDIOGRAM COMPLETE
AR max vel: 2.24 cm2
AV Area VTI: 2.09 cm2
AV Area mean vel: 2.1 cm2
AV Mean grad: 7.5 mmHg
AV Peak grad: 14.1 mmHg
Ao pk vel: 1.88 m/s
Area-P 1/2: 2.87 cm2
Calc EF: 68.4 %
S' Lateral: 2.7 cm
Single Plane A2C EF: 70.4 %
Single Plane A4C EF: 66.5 %

## 2023-11-19 NOTE — Progress Notes (Signed)
  Echocardiogram 2D Echocardiogram has been performed.  Ashley Montgomery 11/19/2023, 12:08 PM

## 2023-12-02 ENCOUNTER — Encounter: Payer: Self-pay | Admitting: Gastroenterology

## 2024-01-04 ENCOUNTER — Ambulatory Visit (AMBULATORY_SURGERY_CENTER): Admitting: *Deleted

## 2024-01-04 VITALS — Ht 61.0 in | Wt 130.0 lb

## 2024-01-04 DIAGNOSIS — Z1211 Encounter for screening for malignant neoplasm of colon: Secondary | ICD-10-CM

## 2024-01-04 MED ORDER — NA SULFATE-K SULFATE-MG SULF 17.5-3.13-1.6 GM/177ML PO SOLN: 1.0000 | Freq: Once | ORAL | 0 refills | Status: AC

## 2024-01-04 NOTE — Progress Notes (Signed)
 Pt's name and DOB verified at the beginning of the pre-visit with 2 identifiers  Pt denies any difficulty with ambulating,sitting, laying down or rolling side to side  Pt has no issues moving head neck or swallowing  No egg or soy allergy known to patient   No issues known to pt with past sedation except last colon she woke up aggressive  No FH of Malignant Hyperthermia  Pt is not on home 02   Pt is not on blood thinners   Pt denies issues with constipation   Pt is not on dialysis  Pt denise any abnormal heart rhythmsdoes have heart mumur   Pt denies any upcoming cardiac testing  Patient's chart reviewed by Norleen Schillings CNRA prior to pre-visit and patient appropriate for the [facility].  Pre-visit completed and red dot placed by patient's name on their procedure day (on provider's schedule).    Visit by phone  Pt states weight is 130 lb   Pt given  both [facility] main # and MD on call # prior to instructions.  Informed pt to come in at the time discussed and is shown on PV instructions.  Pt instructed to use [employer] or GoodRx for a price reduction on prep  Instructed pt where to find PV instructions in My Ch. Copy of instructions  to be sent in mail and address read back to pt to verify correct on envelope. Instructed pt on all aspects of written instructions including med holds clothing to wear and foods to eat and not eat as well as after procedure legal restrictions and to call MD on call if needed.. Pt states understanding. Instructed pt to review instructions again prior to procedure and call main # given if has any questions or any issues. Pt states they will.

## 2024-01-14 ENCOUNTER — Encounter: Payer: Self-pay | Admitting: Gastroenterology

## 2024-01-18 ENCOUNTER — Ambulatory Visit (AMBULATORY_SURGERY_CENTER): Admitting: Gastroenterology

## 2024-01-18 ENCOUNTER — Encounter: Payer: Self-pay | Admitting: Gastroenterology

## 2024-01-18 VITALS — BP 154/88 | HR 73 | Temp 98.1°F | Resp 12 | Ht 61.0 in | Wt 130.0 lb

## 2024-01-18 DIAGNOSIS — D120 Benign neoplasm of cecum: Secondary | ICD-10-CM

## 2024-01-18 DIAGNOSIS — Z1211 Encounter for screening for malignant neoplasm of colon: Secondary | ICD-10-CM

## 2024-01-18 DIAGNOSIS — K635 Polyp of colon: Secondary | ICD-10-CM

## 2024-01-18 DIAGNOSIS — K621 Rectal polyp: Secondary | ICD-10-CM

## 2024-01-18 DIAGNOSIS — D128 Benign neoplasm of rectum: Secondary | ICD-10-CM

## 2024-01-18 DIAGNOSIS — D125 Benign neoplasm of sigmoid colon: Secondary | ICD-10-CM

## 2024-01-18 DIAGNOSIS — D122 Benign neoplasm of ascending colon: Secondary | ICD-10-CM

## 2024-01-18 MED ORDER — SODIUM CHLORIDE 0.9 % IV SOLN: 500.0000 mL | Freq: Once | INTRAVENOUS | Status: AC

## 2024-01-18 NOTE — Progress Notes (Signed)
 VS by KP  Pt's states no medical or surgical changes since previsit or office visit.

## 2024-01-18 NOTE — Patient Instructions (Signed)
-  Handout on polyps provided -Await pathology results  YOU HAD AN ENDOSCOPIC PROCEDURE TODAY AT THE [facility]:   Refer to the procedure report that was given to you for any specific questions about what was found during the examination.  If the procedure report does not answer your questions, please call your gastroenterologist to clarify.  If you requested that your care partner not be given the details of your procedure findings, then the procedure report has been included in a sealed envelope for you to review at your convenience later.  YOU SHOULD EXPECT: Some feelings of bloating in the abdomen. Passage of more gas than usual.  Walking can help get rid of the air that was put into your GI tract during the procedure and reduce the bloating. If you had a lower endoscopy (such as a colonoscopy or flexible sigmoidoscopy) you may notice spotting of blood in your stool or on the toilet paper. If you underwent a bowel prep for your procedure, you may not have a normal bowel movement for a few days.  Please Note:  You might notice some irritation and congestion in your nose or some drainage.  This is from the oxygen used during your procedure.  There is no need for concern and it should clear up in a day or so.  SYMPTOMS TO REPORT IMMEDIATELY:  Following lower endoscopy (colonoscopy or flexible sigmoidoscopy):  Excessive amounts of blood in the stool  Significant tenderness or worsening of abdominal pains  Swelling of the abdomen that is new, acute  Fever of 100F or higher  For urgent or emergent issues, a gastroenterologist can be reached at any hour by calling [phone]. Do not use MyChart messaging for urgent concerns.    DIET:  We do recommend a small meal at first, but then you may proceed to your regular diet.  Drink plenty of fluids but you should avoid alcoholic beverages for 24 hours.  ACTIVITY:  You should plan to take it easy for the rest of today and you should NOT  DRIVE or use heavy machinery until tomorrow (because of the sedation medicines used during the test).    FOLLOW UP: Our staff will call the number listed on your records the next business day following your procedure.  We will call around 7:15- 8:00 am to check on you and address any questions or concerns that you may have regarding the information given to you following your procedure. If we do not reach you, we will leave a message.     If any biopsies were taken you will be contacted by phone or by letter within the next 1-3 weeks.  Please call us  at [phone] if you have not heard about the biopsies in 3 weeks.    SIGNATURES/CONFIDENTIALITY: You and/or your care partner have signed paperwork which will be entered into your electronic medical record.  These signatures attest to the fact that that the information above on your After Visit Summary has been reviewed and is understood.  Full responsibility of the confidentiality of this discharge information lies with you and/or your care-partner.

## 2024-01-18 NOTE — Progress Notes (Signed)
 GASTROENTEROLOGY PROCEDURE H&P NOTE   Primary Care Physician: Leva Rush, MD    Reason for Procedure:  Colon Cancer screening  Plan:    Colonoscopy  Patient is appropriate for endoscopic procedure(s) in the ambulatory ([facility]) setting.  The nature of the procedure, as well as the risks, benefits, and alternatives were carefully and thoroughly reviewed with the patient. Ample time for discussion and questions allowed. The patient understood, was satisfied, and agreed to proceed.     HPI: Ashley Montgomery is a 68 y.o. female who presents for colonoscopy for routine Colon Cancer screening.  No active GI symptoms.  No known family history of colon cancer or related malignancy.  Patient is otherwise without complaints or active issues today.  Last colonoscopy was 03/2009 and notable for rectal hyperplastic polyps.   Past Medical History:  Diagnosis Date   Breast pain, left    Heart murmur    Hot flashes, menopausal    Hypertension    Left thyroid  nodule    Low back pain radiating to lower extremity    Postmenopausal    Sleep disturbances    Vaginal dryness     Past Surgical History:  Procedure Laterality Date   colonscopy  2011   [facility]ARY SURGERY     vasomotor symptomatology      Prior to Admission medications   Medication Sig Start Date End Date Taking? Authorizing Provider  amLODipine (NORVASC) 10 MG tablet daily. 12/03/23  Yes [provider]  aspirin 81 MG chewable tablet Chew 81 mg by mouth daily. Patient not taking: Reported on 01/04/2024    [provider]  MULTIPLE VITAMINS-MINERALS PO Take 1 tablet by mouth daily. Patient not taking: Reported on 01/04/2024    [provider]    Current Outpatient Medications  Medication Sig Dispense Refill   amLODipine (NORVASC) 10 MG tablet daily.     aspirin 81 MG chewable tablet Chew 81 mg by mouth daily. (Patient not taking: Reported on 01/04/2024)     MULTIPLE VITAMINS-MINERALS PO Take 1 tablet by  mouth daily. (Patient not taking: Reported on 01/04/2024)     Current Facility-Administered Medications  Medication Dose Route Frequency Provider Last Rate Last Admin   0.9 %  sodium chloride infusion  500 mL Intravenous Once Brandin Stetzer V, DO        Allergies as of 01/18/2024   (No Known Allergies)    Family History  Problem Relation Age of Onset   Heart attack Mother 47   Heart failure Sister    Diabetes Sister    Colon cancer Neg Hx    Colon polyps Neg Hx    Esophageal cancer Neg Hx    Rectal cancer Neg Hx    Stomach cancer Neg Hx     Social History   Socioeconomic History   Marital status: Widowed    Spouse name: Not on file   Number of children: Not on file   Years of education: Not on file   Highest education level: Not on file  Occupational History   Not on file  Tobacco Use   Smoking status: Every Day   Smokeless tobacco: Never  Vaping Use   Vaping status: Never Used  Substance and Sexual Activity   Alcohol use: Yes   Drug use: No   Sexual activity: Not on file  Other Topics Concern   Not on file  Social History Narrative   Not on file   Social Drivers of Health   Financial  Resource Strain: Not on file  Food Insecurity: Not on file  Transportation Needs: Not on file  Physical Activity: Not on file  Stress: Not on file  Social Connections: Unknown (08/01/2021)   Received from [facility]   Social Network    Social Network: Not on file  Intimate Partner Violence: Unknown (06/24/2021)   Received from [facility]   HITS    Physically Hurt: Not on file    Insult or Talk Down To: Not on file    Threaten Physical Harm: Not on file    Scream or Curse: Not on file    Physical Exam: Vital signs in last 24 hours: @BP  (!) 155/84   Pulse 73   Temp 98.1 F (36.7 C)   Resp (!) 99   Ht 5' 1 (1.549 m)   Wt 130 lb (59 kg)   BMI 24.56 kg/m  GEN: NAD EYE: Sclerae anicteric ENT: MMM CV: Non-tachycardic Pulm: CTA b/l GI: Soft, NT/ND NEURO:   Alert & Oriented x 3   Sandor Flatter, DO [facility]   01/18/2024 8:08 AM

## 2024-01-18 NOTE — Op Note (Signed)
 [facility] Patient Name: Ashley Montgomery Procedure Date: 01/18/2024 7:59 AM MRN: [id number] Endoscopist: Sandor Flatter , MD, [id number] Age: 68 Referring MD:  Date of Birth: 07-01-1955 Gender: Female Account #: [id number] Procedure:                Colonoscopy Indications:              Screening for colorectal malignant neoplasm (last                            colonoscopy was more than 10 years ago)                           Last colonoscopy was 03/2009 and notable for rectal                            hyperplastic polyps. Medicines:                Monitored Anesthesia Care Procedure:                Pre-Anesthesia Assessment:                           - Prior to the procedure, a History and Physical                            was performed, and patient medications and                            allergies were reviewed. [name] tolerance of                            previous anesthesia was also reviewed. The risks                            and benefits of the procedure and the sedation                            options and risks were discussed with the patient.                            All questions were answered, and informed consent                            was obtained. Prior Anticoagulants: The patient has                            taken no anticoagulant or antiplatelet agents. ASA                            Grade Assessment: II - A patient with mild systemic                            disease. After reviewing the risks and benefits,  the patient was deemed in satisfactory condition to                            undergo the procedure.                           After obtaining informed consent, the colonoscope                            was passed under direct vision. Throughout the                            procedure, the patient's blood pressure, pulse, and                            oxygen saturations were monitored continuously.  The                            Olympus Scope SN [id number] was introduced through the                            anus and advanced to the the cecum, identified by                            appendiceal orifice and ileocecal valve. The                            colonoscopy was performed without difficulty. The                            patient tolerated the procedure well. The quality                            of the bowel preparation was good. The ileocecal                            valve, appendiceal orifice, and rectum were                            photographed. Scope In: 8:16:45 AM Scope Out: 8:41:17 AM Scope Withdrawal Time: 0 hours 20 minutes 38 seconds  Total Procedure Duration: 0 hours 24 minutes 32 seconds  Findings:                 The perianal and digital rectal examinations were                            normal.                           Two sessile polyps were found in the ascending                            colon and cecum. The polyps were 3 to 6 mm in size.  These polyps were removed with a cold snare.                            Resection and retrieval were complete. Estimated                            blood loss was minimal.                           Four sessile polyps were found in the sigmoid                            colon. The polyps were 2 to 4 mm in size. These                            polyps were removed with a cold snare. Resection                            and retrieval were complete. Estimated blood loss                            was minimal.                           Multiple diminutive polyps were found in the                            rectum. The polyps were 1 to 3 mm in size. Eight of                            these polyps were removed with a cold snare.                            Resection and retrieval were complete. Estimated                            blood loss was minimal.                           The  retroflexed view of the distal rectum and anal                            verge was normal and showed no anal or rectal                            abnormalities. Complications:            No immediate complications. Estimated Blood Loss:     Estimated blood loss was minimal. Impression:               - Two 3 to 6 mm polyps in the ascending colon and                            in the cecum, removed with a  cold snare. Resected                            and retrieved.                           - Four 2 to 4 mm polyps in the sigmoid colon,                            removed with a cold snare. Resected and retrieved.                           - Eight 1 to 3 mm polyps in the rectum, removed                            with a cold snare. Resected and retrieved.                           - The distal rectum and anal verge are normal on                            retroflexion view. Recommendation:           - Patient has a contact number available for                            emergencies. The signs and symptoms of potential                            delayed complications were discussed with the                            patient. Return to normal activities tomorrow.                            Written discharge instructions were provided to the                            patient.                           - Resume previous diet.                           - Continue present medications.                           - Await pathology results.                           - Repeat colonoscopy for surveillance based on                            pathology results.                           -  Return to GI office PRN. Sandor Flatter, MD 01/18/2024 8:48:01 AM

## 2024-01-18 NOTE — Progress Notes (Signed)
 To pacu, VSS. Report to [email or web address]

## 2024-01-18 NOTE — Progress Notes (Signed)
 Called to room to assist during endoscopic procedure.  Patient ID and intended procedure confirmed with present staff. Received instructions for my participation in the procedure from the performing physician.

## 2024-01-19 ENCOUNTER — Telehealth: Payer: Self-pay | Admitting: *Deleted

## 2024-01-19 NOTE — Telephone Encounter (Signed)
  Follow up Call-     01/18/2024    7:31 AM  Call back number  Post procedure Call Back phone  # [phone]  Permission to leave phone message Yes     Patient questions:  Do you have a fever, pain , or abdominal swelling? No. Pain Score  0 *  Have you tolerated food without any problems? Yes.    Have you been able to return to your normal activities? Yes.    Do you have any questions about your discharge instructions: Diet   No. Medications  No. Follow up visit  No.  Do you have questions or concerns about your Care? No.  Actions: * If pain score is 4 or above: No action needed, pain <4.

## 2024-01-20 LAB — SURGICAL PATHOLOGY

## 2024-01-27 ENCOUNTER — Ambulatory Visit: Payer: Self-pay | Admitting: Gastroenterology
